# Patient Record
Sex: Female | Born: 1940 | Race: White | Hispanic: No | Smoking: Never smoker
Health system: Southern US, Community
[De-identification: ages and names within clinical notes are randomized; demographics above are authoritative.]

## PROBLEM LIST (undated history)

## (undated) DIAGNOSIS — N200 Calculus of kidney: Secondary | ICD-10-CM

## (undated) DIAGNOSIS — N390 Urinary tract infection, site not specified: Secondary | ICD-10-CM

## (undated) DIAGNOSIS — N816 Rectocele: Secondary | ICD-10-CM

## (undated) HISTORY — PX: RECTOCELE REPAIR: SHX761

## (undated) HISTORY — PX: HYSTERECTOMY ABDOMINAL WITH SALPINGECTOMY: SHX6725

## (undated) HISTORY — PX: BACK SURGERY: SHX140

## (undated) HISTORY — PX: CARDIAC ELECTROPHYSIOLOGY STUDY AND ABLATION: SHX1294

---

## 1998-07-04 ENCOUNTER — Other Ambulatory Visit: Admission: RE | Admit: 1998-07-04 | Discharge: 1998-07-04 | Payer: Self-pay | Admitting: Oral Surgery

## 2000-08-06 ENCOUNTER — Other Ambulatory Visit: Admission: RE | Admit: 2000-08-06 | Discharge: 2000-08-06 | Payer: Self-pay | Admitting: Obstetrics and Gynecology

## 2001-04-28 ENCOUNTER — Other Ambulatory Visit: Admission: RE | Admit: 2001-04-28 | Discharge: 2001-04-28 | Payer: Self-pay | Admitting: Obstetrics and Gynecology

## 2001-09-06 ENCOUNTER — Other Ambulatory Visit: Admission: RE | Admit: 2001-09-06 | Discharge: 2001-09-06 | Payer: Self-pay | Admitting: Obstetrics and Gynecology

## 2011-03-12 DIAGNOSIS — F329 Major depressive disorder, single episode, unspecified: Secondary | ICD-10-CM | POA: Insufficient documentation

## 2011-06-04 ENCOUNTER — Other Ambulatory Visit: Payer: Self-pay | Admitting: Sports Medicine

## 2011-06-04 ENCOUNTER — Ambulatory Visit
Admission: RE | Admit: 2011-06-04 | Discharge: 2011-06-04 | Disposition: A | Discharge status: Home / Self Care | Payer: Medicare FFS | Source: Ambulatory Visit | Attending: Sports Medicine | Admitting: Sports Medicine

## 2011-06-04 DIAGNOSIS — R52 Pain, unspecified: Secondary | ICD-10-CM

## 2013-07-27 DIAGNOSIS — R002 Palpitations: Secondary | ICD-10-CM | POA: Insufficient documentation

## 2013-08-08 DIAGNOSIS — F411 Generalized anxiety disorder: Secondary | ICD-10-CM | POA: Insufficient documentation

## 2013-12-28 DIAGNOSIS — L658 Other specified nonscarring hair loss: Secondary | ICD-10-CM | POA: Insufficient documentation

## 2013-12-28 DIAGNOSIS — L739 Follicular disorder, unspecified: Secondary | ICD-10-CM | POA: Insufficient documentation

## 2014-12-17 DIAGNOSIS — R0683 Snoring: Secondary | ICD-10-CM | POA: Insufficient documentation

## 2014-12-17 DIAGNOSIS — G4733 Obstructive sleep apnea (adult) (pediatric): Secondary | ICD-10-CM | POA: Insufficient documentation

## 2014-12-17 DIAGNOSIS — G4710 Hypersomnia, unspecified: Secondary | ICD-10-CM | POA: Insufficient documentation

## 2015-09-02 DIAGNOSIS — M533 Sacrococcygeal disorders, not elsewhere classified: Secondary | ICD-10-CM | POA: Insufficient documentation

## 2015-09-02 DIAGNOSIS — M79671 Pain in right foot: Secondary | ICD-10-CM | POA: Insufficient documentation

## 2015-09-02 DIAGNOSIS — W19XXXA Unspecified fall, initial encounter: Secondary | ICD-10-CM | POA: Insufficient documentation

## 2015-09-02 DIAGNOSIS — M25552 Pain in left hip: Secondary | ICD-10-CM | POA: Insufficient documentation

## 2015-09-02 DIAGNOSIS — M79672 Pain in left foot: Secondary | ICD-10-CM | POA: Insufficient documentation

## 2015-09-02 DIAGNOSIS — M25551 Pain in right hip: Secondary | ICD-10-CM | POA: Insufficient documentation

## 2015-10-01 DIAGNOSIS — N816 Rectocele: Secondary | ICD-10-CM | POA: Insufficient documentation

## 2015-11-02 DIAGNOSIS — N939 Abnormal uterine and vaginal bleeding, unspecified: Secondary | ICD-10-CM | POA: Insufficient documentation

## 2016-02-04 DIAGNOSIS — M797 Fibromyalgia: Secondary | ICD-10-CM | POA: Insufficient documentation

## 2016-02-05 DIAGNOSIS — I252 Old myocardial infarction: Secondary | ICD-10-CM | POA: Insufficient documentation

## 2016-08-10 DIAGNOSIS — G894 Chronic pain syndrome: Secondary | ICD-10-CM | POA: Insufficient documentation

## 2016-08-10 DIAGNOSIS — M67919 Unspecified disorder of synovium and tendon, unspecified shoulder: Secondary | ICD-10-CM | POA: Insufficient documentation

## 2017-09-21 DIAGNOSIS — F331 Major depressive disorder, recurrent, moderate: Secondary | ICD-10-CM | POA: Insufficient documentation

## 2018-04-12 DIAGNOSIS — R928 Other abnormal and inconclusive findings on diagnostic imaging of breast: Secondary | ICD-10-CM | POA: Insufficient documentation

## 2018-08-22 DIAGNOSIS — F424 Excoriation (skin-picking) disorder: Secondary | ICD-10-CM | POA: Insufficient documentation

## 2018-08-22 DIAGNOSIS — Z86010 Personal history of colonic polyps: Secondary | ICD-10-CM | POA: Insufficient documentation

## 2018-08-22 DIAGNOSIS — R7989 Other specified abnormal findings of blood chemistry: Secondary | ICD-10-CM | POA: Insufficient documentation

## 2018-08-22 DIAGNOSIS — F513 Sleepwalking [somnambulism]: Secondary | ICD-10-CM | POA: Insufficient documentation

## 2018-08-22 DIAGNOSIS — Z86018 Personal history of other benign neoplasm: Secondary | ICD-10-CM | POA: Insufficient documentation

## 2018-08-22 DIAGNOSIS — R32 Unspecified urinary incontinence: Secondary | ICD-10-CM | POA: Insufficient documentation

## 2019-01-17 DIAGNOSIS — M7061 Trochanteric bursitis, right hip: Secondary | ICD-10-CM | POA: Insufficient documentation

## 2019-05-08 DIAGNOSIS — I209 Angina pectoris, unspecified: Secondary | ICD-10-CM | POA: Insufficient documentation

## 2019-08-25 DIAGNOSIS — M23306 Other meniscus derangements, unspecified meniscus, right knee: Secondary | ICD-10-CM | POA: Insufficient documentation

## 2020-01-11 ENCOUNTER — Emergency Department (INDEPENDENT_AMBULATORY_CARE_PROVIDER_SITE_OTHER)
Admission: EM | Admit: 2020-01-11 | Discharge: 2020-01-11 | Disposition: A | Discharge status: Home / Self Care | Payer: Medicare Other | Source: Home / Self Care

## 2020-01-11 ENCOUNTER — Encounter: Payer: Self-pay | Admitting: Emergency Medicine

## 2020-01-11 ENCOUNTER — Emergency Department: Payer: Medicare Other

## 2020-01-11 ENCOUNTER — Emergency Department (INDEPENDENT_AMBULATORY_CARE_PROVIDER_SITE_OTHER): Payer: Medicare Other

## 2020-01-11 DIAGNOSIS — K644 Residual hemorrhoidal skin tags: Secondary | ICD-10-CM

## 2020-01-11 DIAGNOSIS — R319 Hematuria, unspecified: Secondary | ICD-10-CM | POA: Diagnosis not present

## 2020-01-11 DIAGNOSIS — N3091 Cystitis, unspecified with hematuria: Secondary | ICD-10-CM | POA: Diagnosis not present

## 2020-01-11 DIAGNOSIS — D72829 Elevated white blood cell count, unspecified: Secondary | ICD-10-CM

## 2020-01-11 DIAGNOSIS — K5909 Other constipation: Secondary | ICD-10-CM

## 2020-01-11 DIAGNOSIS — I2510 Atherosclerotic heart disease of native coronary artery without angina pectoris: Secondary | ICD-10-CM | POA: Insufficient documentation

## 2020-01-11 DIAGNOSIS — I10 Essential (primary) hypertension: Secondary | ICD-10-CM | POA: Insufficient documentation

## 2020-01-11 DIAGNOSIS — H9319 Tinnitus, unspecified ear: Secondary | ICD-10-CM | POA: Insufficient documentation

## 2020-01-11 DIAGNOSIS — E039 Hypothyroidism, unspecified: Secondary | ICD-10-CM | POA: Insufficient documentation

## 2020-01-11 DIAGNOSIS — E785 Hyperlipidemia, unspecified: Secondary | ICD-10-CM | POA: Insufficient documentation

## 2020-01-11 DIAGNOSIS — J309 Allergic rhinitis, unspecified: Secondary | ICD-10-CM | POA: Insufficient documentation

## 2020-01-11 DIAGNOSIS — M791 Myalgia, unspecified site: Secondary | ICD-10-CM | POA: Insufficient documentation

## 2020-01-11 DIAGNOSIS — R7301 Impaired fasting glucose: Secondary | ICD-10-CM | POA: Insufficient documentation

## 2020-01-11 HISTORY — DX: Urinary tract infection, site not specified: N39.0

## 2020-01-11 HISTORY — DX: Rectocele: N81.6

## 2020-01-11 HISTORY — DX: Calculus of kidney: N20.0

## 2020-01-11 LAB — POCT CBC W AUTO DIFF (K'VILLE URGENT CARE)

## 2020-01-11 LAB — POCT URINALYSIS DIP (MANUAL ENTRY)
Bilirubin, UA: NEGATIVE
Glucose, UA: NEGATIVE mg/dL
Leukocytes, UA: NEGATIVE
Nitrite, UA: NEGATIVE
Protein Ur, POC: 30 mg/dL — AB
Spec Grav, UA: >=1.03 — AB (ref 1.010–1.025)
Urobilinogen, UA: 0.2 E.U./dL
pH, UA: 5 (ref 5.0–8.0)

## 2020-01-11 MED ORDER — CEFTRIAXONE SODIUM 1 G IJ SOLR
1.0000 g | Freq: Once | INTRAMUSCULAR | Status: AC
  Administered 2020-01-11: 1 g via INTRAMUSCULAR

## 2020-01-11 MED ORDER — HYDROCORTISONE ACETATE 25 MG RE SUPP: 25.0000 mg | Freq: Two times a day (BID) | RECTAL | 0 refills | Status: DC

## 2020-01-11 MED ORDER — CEPHALEXIN 500 MG PO CAPS: 500.0000 mg | ORAL_CAPSULE | Freq: Two times a day (BID) | ORAL | 0 refills | Status: AC

## 2020-01-11 MED ORDER — POLYETHYLENE GLYCOL 3350 17 G PO PACK: 17.0000 g | PACK | Freq: Every day | ORAL | 0 refills | Status: AC

## 2020-01-11 NOTE — Discharge Instructions (Addendum)
  Please take your mediations as prescribed. Be sure to drink plenty of water to help with your symptoms including constipation. Be sure to follow up closely next week with your primary care provider as well as schedule an appointment with a urologist for further evaluation and treatment of urinary symptoms next week.   Call 911 or have someone drive you to the hospital if symptoms significantly worsening- including unable to urinate, abdominal pain or bloating (not being able to pass gas or stool), unable to keep down fluids, dizziness/passing out or other new concerning symptoms develop.

## 2020-01-11 NOTE — ED Triage Notes (Addendum)
Dysuria x 3 days -treating at home w/ AZO & cranberry pills per pt Chronic UTI's (5 in last 6 months) Hospitalized for urosepsis in July (can not remember exact day) No Urologist  NO COVID Vaccine  Unable to provide a urine sample in triage - given water Pt states she cleans her peri area with vagisil in the am & takes a bath in the PM

## 2020-01-11 NOTE — ED Provider Notes (Signed)
[facility]    CSN: [id number] Arrival date & time: 01/11/20  1922      History   Chief Complaint Chief Complaint  Patient presents with  . Dysuria  . Headache    HPI Christina Haley is a 79 y.o. female.   HPI  Christina Haley is a 79 y.o. female presenting to UC with c/o dysuria for 3 days, associated mild generalized HA, urinary frequency, decreased urine and constipation. She has been taking Azo and cranberry pills to help with symptoms.  She reports having "UTIs" within the last 5-6 weeks but was never seen by a provider, just self-treated at home with Azo.  She was hospitalized for 5 days in April 2021 for urosepsis.  Per medical records, she was found unresponsive and did not have urinary symptoms leading up to the diagnosis in the hospital.  She has not f/u with urology or PCP for urinary symptoms since her admission.   Denies fever, chills, n/v/d. She has bladder pressure but denies abdominal pain. She does have bilateral lower flank pain. Hx of rectocele, surgically repaired in 2017.    Past Medical History:  Diagnosis Date  . Kidney stone   . Rectocele   . UTI (urinary tract infection)     Patient Active Problem List   Diagnosis Date Noted  . Acquired hypothyroidism 01/11/2020  . Allergic rhinitis 01/11/2020  . Coronary artery disease involving native coronary artery of native heart without angina pectoris 01/11/2020  . Essential hypertension 01/11/2020  . Hyperlipidemia 01/11/2020  . Impaired fasting glucose 01/11/2020  . Myalgia 01/11/2020  . Tinnitus 01/11/2020  . Meniscus degeneration, right 08/25/2019  . Angina pectoris (HCC) 05/08/2019  . Trochanteric bursitis of right hip 01/17/2019  . Elevated TSH 08/22/2018  . 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 History of colon polyps 08/22/2018  . Skin picking habit 08/22/2018  . Sleep walking and eating 08/22/2018  . Urinary incontinence 08/22/2018  . Abnormality of left breast on screening mammogram  04/12/2018  . Moderate episode of recurrent major depressive disorder (HCC) 09/21/2017  . Chronic pain syndrome 08/10/2016  . Tendinopathy of rotator cuff 08/10/2016  . History of non-ST elevation myocardial infarction (NSTEMI) 02/05/2016  . Fibromyalgia 02/04/2016  . Abnormal uterine bleeding 11/02/2015  . Rectocele 10/01/2015  . Coccyx pain 09/02/2015  . Falls 09/02/2015  . Bilateral hip pain 09/02/2015  . Pain in both feet 09/02/2015  . Hypersomnia 12/17/2014  . Obstructive sleep apnea (adult) (pediatric) 12/17/2014  . Snoring 12/17/2014  . 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 Folliculitis 12/28/2013  . Generalized anxiety disorder 08/08/2013  . Palpitations 07/27/2013  . Other depressive disorder 03/12/2011    Past Surgical History:  Procedure Laterality Date  . HYSTERECTOMY ABDOMINAL WITH SALPINGECTOMY    . RECTOCELE REPAIR      OB History   No obstetric history on file.      Home Medications    Prior to Admission medications   Medication Sig Start Date End Date Taking? Authorizing Provider  clopidogrel (PLAVIX) 75 MG tablet Take by mouth. 12/18/19  Yes [provider]  DULoxetine (CYMBALTA) 30 MG capsule Take by mouth. 12/18/19  Yes [provider]  levothyroxine (SYNTHROID) 100 MCG tablet Take 1 tablet by mouth daily. 09/06/19  Yes [provider]  nitroGLYCERIN (NITROSTAT) 0.4 MG SL tablet Take 1 tablet by mouth every 5 minutes x 3 doses.  If no improvement after third dose, call 911. 05/08/19  Yes [provider]  pregabalin (  LYRICA) 25 MG capsule Take by mouth. 12/18/19  Yes [provider]  ranolazine (RANEXA) 500 MG 12 hr tablet Take by mouth. 12/18/19  Yes [provider]  rosuvastatin (CRESTOR) 10 MG tablet Take by mouth. 12/18/19  Yes [provider]  valACYclovir (VALTREX) 1000 MG tablet Take 2 tablet by mouth at the onset of a cold sore, and repeat 2 tablets in 12 hours - max 4 tablets per cold sore  12/18/19  Yes [provider]  cephALEXin (KEFLEX) 500 MG capsule Take 1 capsule (500 mg total) by mouth 2 (two) times daily for 10 days. 01/11/20 01/21/20  Lurene Shadow, PA-C  hydrocortisone (ANUSOL-HC) 25 MG suppository Place 1 suppository (25 mg total) rectally 2 (two) times daily. For 7 days 01/11/20   Lurene Shadow, PA-C  polyethylene glycol (MIRALAX / GLYCOLAX) 17 g packet Take 17 g by mouth daily. 01/11/20   Lurene Shadow, PA-C    Family History Family History  Problem Relation Age of Onset  . Diabetes Mother   . Stroke Mother   . Cancer Father   . Heart attack Father     Social History Social History   Tobacco Use  . Smoking status: Never Smoker  . Smokeless tobacco: Never Used  Vaping Use  . Vaping Use: Never used  Substance Use Topics  . Alcohol use: Never  . Drug use: Never     Allergies   Other, Amoxicillin, Codeine, Trazodone, Erythromycin base, and Sulfa antibiotics   Review of Systems Review of Systems  Constitutional: Negative for chills and fever.  HENT: Negative for congestion, ear pain, sore throat, trouble swallowing and voice change.   Respiratory: Negative for cough and shortness of breath.   Cardiovascular: Negative for chest pain and palpitations.  Gastrointestinal: Positive for constipation. Negative for abdominal pain, diarrhea, nausea and vomiting.  Genitourinary: Positive for decreased urine volume, dysuria, frequency and urgency.  Musculoskeletal: Negative for arthralgias, back pain and myalgias.  Skin: Negative for rash.  All other systems reviewed and are negative.    Physical Exam Triage Vital Signs ED Triage Vitals  Enc Vitals Group     BP 01/11/20 1943 (!) 149/86     Pulse Rate 01/11/20 1943 (!) 58     Resp 01/11/20 1943 18     Temp 01/11/20 1943 99.2 F (37.3 C)     Temp Source 01/11/20 1943 Oral     SpO2 01/11/20 1943 96 %     Weight 01/11/20 1945 203 lb (92.1 kg)     Height 01/11/20 1945 5\' 4"  (1.626 m)      Head Circumference --      Peak Flow --      Pain Score 01/11/20 1944 7     Pain Loc --      Pain Edu? --      Excl. in GC? --    No data found.  Updated Vital Signs BP (!) 149/86 (BP Location: Right Arm)   Pulse (!) 58   Temp 99.2 F (37.3 C) (Oral)   Resp 18   Ht 5\' 4"  (1.626 m)   Wt 203 lb (92.1 kg)   SpO2 96%   BMI 34.84 kg/m   Visual Acuity Right Eye Distance:   Left Eye Distance:   Bilateral Distance:    Right Eye Near:   Left Eye Near:    Bilateral Near:     Physical Exam Vitals and nursing note reviewed. Exam conducted with a chaperone present.  Constitutional:      General: She is not in acute distress.    Appearance: She is well-developed. She is not ill-appearing, toxic-appearing or diaphoretic.  HENT:     Head: Normocephalic and atraumatic.  Cardiovascular:     Rate and Rhythm: Normal rate and regular rhythm.  Pulmonary:     Effort: Pulmonary effort is normal. No respiratory distress.     Breath sounds: Normal breath sounds.  Abdominal:     General: There is no distension.     Palpations: Abdomen is soft.     Tenderness: There is no abdominal tenderness.  Genitourinary:    Rectum: External hemorrhoid (2cm, non-thrombosed) present. No mass or tenderness.     Comments: Small external hemorrhoid, otherwise normal rectal exam.  Musculoskeletal:        General: Normal range of motion.     Cervical back: Normal range of motion.  Skin:    General: Skin is warm and dry.  Neurological:     Mental Status: She is alert and oriented to person, place, and time.  Psychiatric:        Behavior: Behavior normal.      UC Treatments / Results  Labs (all labs ordered are listed, but only abnormal results are displayed) Labs Reviewed  POCT URINALYSIS DIP (MANUAL ENTRY) - Abnormal; Notable for the following components:      Result Value   Ketones, POC UA small (15) (*)    Spec Grav, UA >=1.030 (*)    Blood, UA moderate (*)    Protein Ur, POC =30 (*)     All other components within normal limits  URINE CULTURE  COMPLETE METABOLIC PANEL WITH GFR  POCT CBC W AUTO DIFF ([facility])    EKG   Radiology DG Abd 1 View  Result Date: 01/11/2020 CLINICAL DATA:  Back pain, hematuria and urinary frequency. EXAM: ABDOMEN - 1 VIEW COMPARISON:  None. FINDINGS: The bowel gas pattern is normal. A large amount of stool is seen throughout the colon. No radio-opaque calculi or other significant radiographic abnormality are seen. IMPRESSION: 1. Large stool burden without evidence of bowel obstruction. Electronically Signed   By: Aram Candela M.D.   On: 01/11/2020 20:40    Procedures Procedures (including critical care time)  Medications Ordered in UC Medications  cefTRIAXone (ROCEPHIN) injection 1 g (1 g Intramuscular Given 01/11/20 2021)    Initial Impression / Assessment and Plan / UC Course  I have reviewed the triage vital signs and the nursing notes.  Pertinent labs & imaging results that were available during my care of the patient were reviewed by me and considered in my medical decision making (see chart for details).    WBC: 11 Urine culture sent CMP pending  Due to hospitalization for Urosepsis earlier this year, will tx for suspected UTI Encouraged stool softener use Encouraged close f/u with PCP, she has previously scheduled appointment with PCP next Wednesday, 10/27. Encouraged to keep appointment. Discussed symptoms that warrant emergent care in the ED. AVS given   Final Clinical Impressions(s) / UC Diagnoses   Final diagnoses:  Hematuria due to cystitis  Hematuria  Hematuria, unspecified type  Other constipation  External hemorrhoid  Leukocytosis, unspecified type     Discharge Instructions      Please take your mediations as prescribed. Be sure to drink plenty of water to help with your symptoms including constipation. Be sure to follow up closely next week with your primary care provider as well as  schedule an appointment with a urologist for further evaluation and treatment of urinary symptoms next week.   Call 911 or have someone drive you to the hospital if symptoms significantly worsening- including unable to urinate, abdominal pain or bloating (not being able to pass gas or stool), unable to keep down fluids, dizziness/passing out or other new concerning symptoms develop.     ED Prescriptions    Medication Sig Dispense Auth. Provider   cephALEXin (KEFLEX) 500 MG capsule Take 1 capsule (500 mg total) by mouth 2 (two) times daily for 10 days. 20 capsule Doroteo Glassman, Nakai Yard O, PA-C   hydrocortisone (ANUSOL-HC) 25 MG suppository Place 1 suppository (25 mg total) rectally 2 (two) times daily. For 7 days 14 suppository Ronald Vinsant O, PA-C   polyethylene glycol (MIRALAX / GLYCOLAX) 17 g packet Take 17 g by mouth daily. 14 each Lurene Shadow, PA-C     PDMP not reviewed this encounter.   Lurene Shadow, [place] 01/11/20 2108

## 2020-01-12 ENCOUNTER — Telehealth: Payer: Self-pay

## 2020-01-12 LAB — COMPLETE METABOLIC PANEL WITH GFR
AG Ratio: 1.6 (calc) (ref 1.0–2.5)
ALT: 21 U/L (ref 6–29)
AST: 24 U/L (ref 10–35)
Albumin: 4.1 g/dL (ref 3.6–5.1)
Alkaline phosphatase (APISO): 55 U/L (ref 37–153)
BUN/Creatinine Ratio: 11 (calc) (ref 6–22)
BUN: 14 mg/dL (ref 7–25)
CO2: 26 mmol/L (ref 20–32)
Calcium: 9.7 mg/dL (ref 8.6–10.4)
Chloride: 105 mmol/L (ref 98–110)
Creat: 1.27 mg/dL — ABNORMAL HIGH (ref 0.60–0.93)
GFR, Est African American: 46 mL/min/{1.73_m2} — ABNORMAL LOW (ref >=60)
GFR, Est Non African American: 40 mL/min/{1.73_m2} — ABNORMAL LOW (ref >=60)
Globulin: 2.5 g/dL (calc) (ref 1.9–3.7)
Glucose, Bld: 176 mg/dL — ABNORMAL HIGH (ref 65–99)
Potassium: 4.9 mmol/L (ref 3.5–5.3)
Sodium: 140 mmol/L (ref 135–146)
Total Bilirubin: 0.2 mg/dL (ref 0.2–1.2)
Total Protein: 6.6 g/dL (ref 6.1–8.1)

## 2020-01-12 NOTE — Telephone Encounter (Signed)
Called to check on patient after recent visit. No answer at this time, voicemail left.

## 2020-01-13 LAB — URINE CULTURE
MICRO NUMBER:: 11105456
Result:: NO GROWTH
SPECIMEN QUALITY:: ADEQUATE

## 2021-06-16 ENCOUNTER — Other Ambulatory Visit: Payer: Self-pay

## 2021-06-16 ENCOUNTER — Emergency Department (INDEPENDENT_AMBULATORY_CARE_PROVIDER_SITE_OTHER): Payer: Medicare (Managed Care)

## 2021-06-16 ENCOUNTER — Emergency Department
Admission: EM | Admit: 2021-06-16 | Discharge: 2021-06-16 | Disposition: A | Discharge status: Home / Self Care | Payer: Medicare (Managed Care) | Source: Home / Self Care | Attending: Family Medicine | Admitting: Family Medicine

## 2021-06-16 DIAGNOSIS — R519 Headache, unspecified: Secondary | ICD-10-CM

## 2021-06-16 DIAGNOSIS — R110 Nausea: Secondary | ICD-10-CM

## 2021-06-16 DIAGNOSIS — R0989 Other specified symptoms and signs involving the circulatory and respiratory systems: Secondary | ICD-10-CM | POA: Diagnosis not present

## 2021-06-16 DIAGNOSIS — Y92009 Unspecified place in unspecified non-institutional (private) residence as the place of occurrence of the external cause: Secondary | ICD-10-CM

## 2021-06-16 DIAGNOSIS — W19XXXA Unspecified fall, initial encounter: Secondary | ICD-10-CM

## 2021-06-16 DIAGNOSIS — B9689 Other specified bacterial agents as the cause of diseases classified elsewhere: Secondary | ICD-10-CM | POA: Diagnosis not present

## 2021-06-16 DIAGNOSIS — R059 Cough, unspecified: Secondary | ICD-10-CM | POA: Diagnosis not present

## 2021-06-16 DIAGNOSIS — J019 Acute sinusitis, unspecified: Secondary | ICD-10-CM | POA: Diagnosis not present

## 2021-06-16 MED ORDER — BENZONATATE 200 MG PO CAPS: 200.0000 mg | ORAL_CAPSULE | Freq: Two times a day (BID) | ORAL | 0 refills | Status: AC | PRN

## 2021-06-16 MED ORDER — CEFDINIR 300 MG PO CAPS: 300.0000 mg | ORAL_CAPSULE | Freq: Two times a day (BID) | ORAL | 0 refills | Status: AC

## 2021-06-16 MED ORDER — KETOROLAC TROMETHAMINE 15 MG/ML IJ SOLN
15.0000 mg | Freq: Once | INTRAMUSCULAR | Status: AC
  Administered 2021-06-16: 15 mg via INTRAMUSCULAR

## 2021-06-16 MED ORDER — PREDNISONE 20 MG PO TABS: 20.0000 mg | ORAL_TABLET | Freq: Two times a day (BID) | ORAL | 0 refills | Status: AC

## 2021-06-16 NOTE — Discharge Instructions (Addendum)
Take antibiotic Omnicef 2 times a day. ?Take the prednisone 2 times a day.  This will help with the congestion and the wheezing ?I have prescribed Tessalon Perles for the cough ?Drink lots of water ?Get a saline/salt water nasal rinse ?See your doctor tomorrow ?See your ENT next week ?

## 2021-06-16 NOTE — ED Provider Notes (Signed)
?[facility] ? ? ? ?CSN: [id number] ?Arrival date & time: 06/16/21  1557 ? ? ?  ? ?History   ?Chief Complaint ?Chief Complaint  ?Patient presents with  ? Facial Injury  ?  Facial injury. X3 weeks  ? Cough  ?  Cough, head congestion and nausea. X2-3 weeks  ? ? ?HPI ?Christina Haley is a 81 y.o. female.  ? ?HPI ? ?Patient is here for 2 problems.  First she had a fall about 3 weeks ago.  Was walking down steps.  It was dark at the bottom of the steps.  She missed the last step and fell forward.  Hit her face.  Had a nosebleed.  Had some bruising that is resolved.  Continues to have pain in the left side of her cheek, and her nose, and cannot breathe through her left nostril.  She states that she called her ear nose and throat doctor.  She has an appointment for next week.  She states that they told her "be sure to bring x-rays". ?Patient also has chronic bronchitis.  She has sinus congestion, postnasal drip, mucus and cough.  Productive cough.  Chest hurts from coughing.  She is tired from all the coughing.  States that she has had a fever.  Thinks she had a fever today.  States she has not had pneumonia.  She has not had vaccinations for COVID or flu.  This has been going on for a couple of weeks, does not think it is COVID.  She states that she has been staying with her daughter because she feels too sick to be living alone.  Her daughter lives in [place] and recommended she come here for evaluation.  Interestingly, she has an appointment with her primary care doctor tomorrow.  She came here today because she feels sick and she desires the face x-rays ? ?Past Medical History:  ?Diagnosis Date  ? Kidney stone   ? Rectocele   ? UTI (urinary tract infection)   ? ? ?Patient Active Problem List  ? Diagnosis Date Noted  ? Acquired hypothyroidism 01/11/2020  ? Allergic rhinitis 01/11/2020  ? Coronary artery disease involving native coronary artery of native heart without angina pectoris 01/11/2020  ? Essential  hypertension 01/11/2020  ? Hyperlipidemia 01/11/2020  ? Impaired fasting glucose 01/11/2020  ? Myalgia 01/11/2020  ? Tinnitus 01/11/2020  ? Meniscus degeneration, right 08/25/2019  ? Angina pectoris (HCC) 05/08/2019  ? Trochanteric bursitis of right hip 01/17/2019  ? Elevated TSH 08/22/2018  ? 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 History of colon polyps 08/22/2018  ? Skin picking habit 08/22/2018  ? Sleep walking and eating 08/22/2018  ? Urinary incontinence 08/22/2018  ? Abnormality of left breast on screening mammogram 04/12/2018  ? Moderate episode of recurrent major depressive disorder (HCC) 09/21/2017  ? Chronic pain syndrome 08/10/2016  ? Tendinopathy of rotator cuff 08/10/2016  ? History of non-ST elevation myocardial infarction (NSTEMI) 02/05/2016  ? Fibromyalgia 02/04/2016  ? Abnormal uterine bleeding 11/02/2015  ? Rectocele 10/01/2015  ? Coccyx pain 09/02/2015  ? Falls 09/02/2015  ? Bilateral hip pain 09/02/2015  ? Pain in both feet 09/02/2015  ? Hypersomnia 12/17/2014  ? Obstructive sleep apnea (adult) (pediatric) 12/17/2014  ? Snoring 12/17/2014  ? 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 Folliculitis 12/28/2013  ? Generalized anxiety disorder 08/08/2013  ? Palpitations 07/27/2013  ? Other depressive disorder 03/12/2011  ? ? ?Past Surgical History:  ?Procedure Laterality Date  ? BACK SURGERY    ?  CARDIAC ELECTROPHYSIOLOGY STUDY AND ABLATION    ? HYSTERECTOMY ABDOMINAL WITH SALPINGECTOMY    ? RECTOCELE REPAIR    ? ? ?OB History   ?No obstetric history on file. ?  ? ? ? ?Home Medications   ? ?Prior to Admission medications   ?Medication Sig Start Date End Date Taking? Authorizing Provider  ?benzonatate (TESSALON) 200 MG capsule Take 1 capsule (200 mg total) by mouth 2 (two) times daily as needed for cough. 06/16/21  Yes Eustace MooreNelson, Lorynn Moeser Sue, MD  ?cefdinir (OMNICEF) 300 MG capsule Take 1 capsule (300 mg total) by mouth 2 (two) times daily. 06/16/21  Yes Eustace MooreNelson, Dahlia Nifong Sue, MD  ?clopidogrel (PLAVIX) 75 MG  tablet Take by mouth. 12/18/19  Yes [provider]  ?DULoxetine (CYMBALTA) 30 MG capsule Take by mouth. 12/18/19  Yes [provider]  ?levothyroxine (SYNTHROID) 100 MCG tablet Take 1 tablet by mouth daily. 09/06/19  Yes [provider]  ?polyethylene glycol (MIRALAX / GLYCOLAX) 17 g packet Take 17 g by mouth daily. 01/11/20  Yes Lurene ShadowPhelps, Erin O, PA-C  ?predniSONE (DELTASONE) 20 MG tablet Take 1 tablet (20 mg total) by mouth 2 (two) times daily with a meal. 06/16/21  Yes Eustace MooreNelson, Pawan Knechtel Sue, MD  ?pregabalin (LYRICA) 25 MG capsule Take by mouth. 12/18/19  Yes [provider]  ?ranolazine (RANEXA) 500 MG 12 hr tablet Take by mouth. 12/18/19  Yes [provider]  ?rosuvastatin (CRESTOR) 10 MG tablet Take by mouth. 12/18/19  Yes [provider]  ?valACYclovir (VALTREX) 1000 MG tablet Take 2 tablet by mouth at the onset of a cold sore, and repeat 2 tablets in 12 hours - max 4 tablets per cold sore 12/18/19  Yes [provider]  ?nitroGLYCERIN (NITROSTAT) 0.4 MG SL tablet Take 1 tablet by mouth every 5 minutes x 3 doses.  If no improvement after third dose, call 911. 05/08/19   [provider]  ? ? ?Family History ?Family History  ?Problem Relation Age of Onset  ? Diabetes Mother   ? Stroke Mother   ? Cancer Father   ? Heart attack Father   ? ? ?Social History ?Social History  ? ?Tobacco Use  ? Smoking status: Never  ? Smokeless tobacco: Never  ?Vaping Use  ? Vaping Use: Never used  ?Substance Use Topics  ? Alcohol use: Never  ? Drug use: Never  ? ? ? ?Allergies   ?Other, Amoxicillin, Codeine, Trazodone, Erythromycin base, and Sulfa antibiotics ? ? ?Review of Systems ?Review of Systems ?See HPI ? ?Physical Exam ?Triage Vital Signs ?ED Triage Vitals  ?Enc Vitals Group  ?   BP 06/16/21 1622 135/71  ?   Pulse Rate 06/16/21 1622 67  ?   Resp 06/16/21 1622 18  ?   Temp 06/16/21 1622 99.6 ?F (37.6 ?C)  ?   Temp Source 06/16/21 1622 Oral  ?   SpO2 06/16/21 1622 94 %   ?   Weight 06/16/21 1618 189 lb (85.7 kg)  ?   Height 06/16/21 1618 5\' 4"  (1.626 m)  ?   Head Circumference --   ?   Peak Flow --   ?   Pain Score 06/16/21 1618 7  ?   Pain Loc --   ?   Pain Edu? --   ?   Excl. in GC? --   ? ?No data found. ? ?Updated Vital Signs ?BP 135/71 (BP Location: Right Arm)   Pulse 67   Temp 99.6 ?F (37.6 ?C) (Oral)  Resp 18   Ht 5\' 4"  (1.626 m)   Wt 85.7 kg   SpO2 94%   BMI 32.44 kg/m?  ?    ? ?Physical Exam ?Constitutional:   ?   General: She is not in acute distress. ?   Appearance: She is well-developed. She is obese. She is ill-appearing.  ?HENT:  ?   Head: Normocephalic and atraumatic.  ?   Right Ear: Tympanic membrane and ear canal normal.  ?   Left Ear: Tympanic membrane and ear canal normal.  ?   Nose: Congestion present.  ?   Comments: Nasal membranes are swollen and red.  The nose was examined with a speculum.  No obstruction seen ?   Mouth/Throat:  ?   Mouth: Mucous membranes are dry.  ?   Comments: Tongue is dry.  Posterior pharynx shows some yellow PN D.  Sinus tenderness present on the left maxillary region only ?Eyes:  ?   Conjunctiva/sclera: Conjunctivae normal.  ?   Pupils: Pupils are equal, round, and reactive to light.  ?Cardiovascular:  ?   Rate and Rhythm: Normal rate and regular rhythm.  ?Pulmonary:  ?   Effort: Pulmonary effort is normal. No respiratory distress.  ?   Breath sounds: Wheezing and rhonchi present.  ?   Comments: Patient has a harsh barking cough.  She has rhonchi in both lungs with scattered inspiratory wheeze. ?Abdominal:  ?   General: There is no distension.  ?   Palpations: Abdomen is soft.  ?Musculoskeletal:     ?   General: Normal range of motion.  ?   Cervical back: Normal range of motion.  ?Lymphadenopathy:  ?   Cervical: No cervical adenopathy.  ?Skin: ?   General: Skin is warm and dry.  ?Neurological:  ?   General: No focal deficit present.  ?   Mental Status: She is alert.  ?Psychiatric:     ?   Mood and Affect: Mood normal.     ?    Behavior: Behavior normal.  ? ? ? ?UC Treatments / Results  ?Labs ?(all labs ordered are listed, but only abnormal results are displayed) ?Labs Reviewed - No data to display ? ?EKG ? ? ?Radiology ?DG Facial Bones C

## 2021-06-16 NOTE — ED Triage Notes (Signed)
Pt states that she fell x3 weeks ago and has some facial pain. Pt states that she injured her noise and has some facial pain. X3 weeks ? ?Pt states that she also has a cough, nausea and head congestion. X2-3 weeks ? ?Pt states that she isn't vaccinated for covid.  ?Pt states that she has had flu vaccine.  ?

## 2022-12-16 IMAGING — DX DG FACIAL BONES COMPLETE 3+V
6 series · 6 of 6 positions shown · non-contrast
Comparison: None.

CLINICAL DATA: Fell 3 weeks ago, facial pain, cough and congestion

EXAM:
FACIAL BONES COMPLETE 3+V

[facial pa townes]
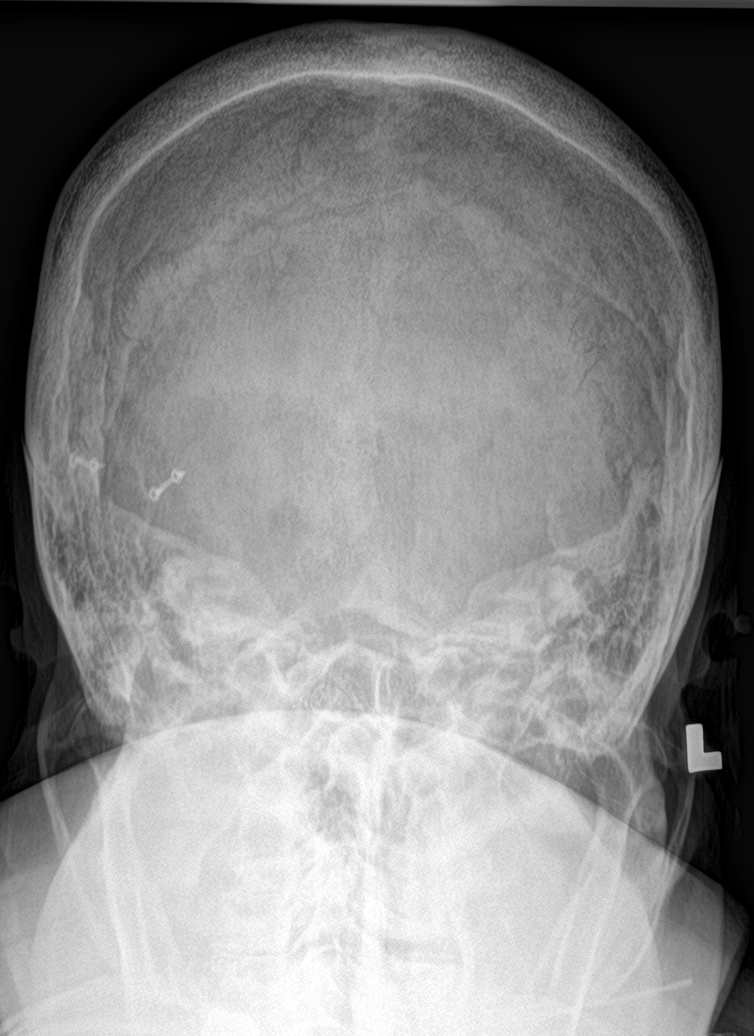

[facial waters (1 of 2)]
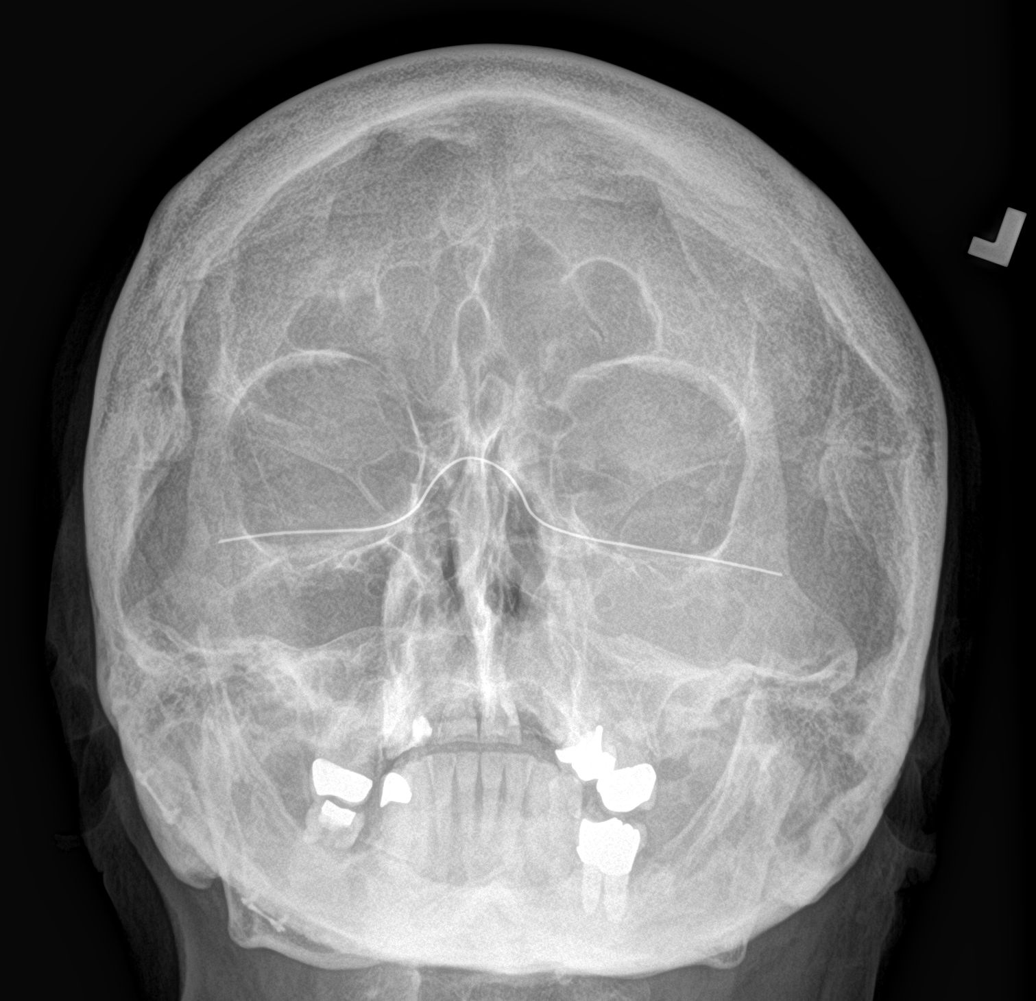

[facial lateral (1 of 2)]
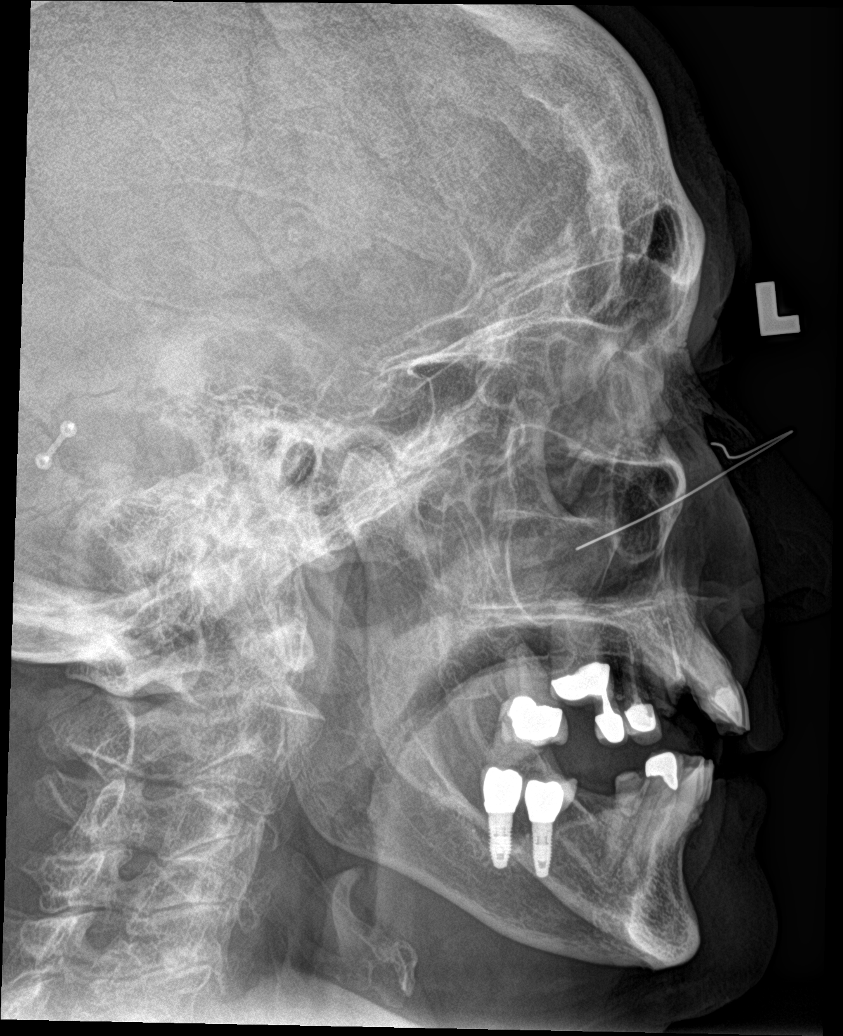

[facial smv]
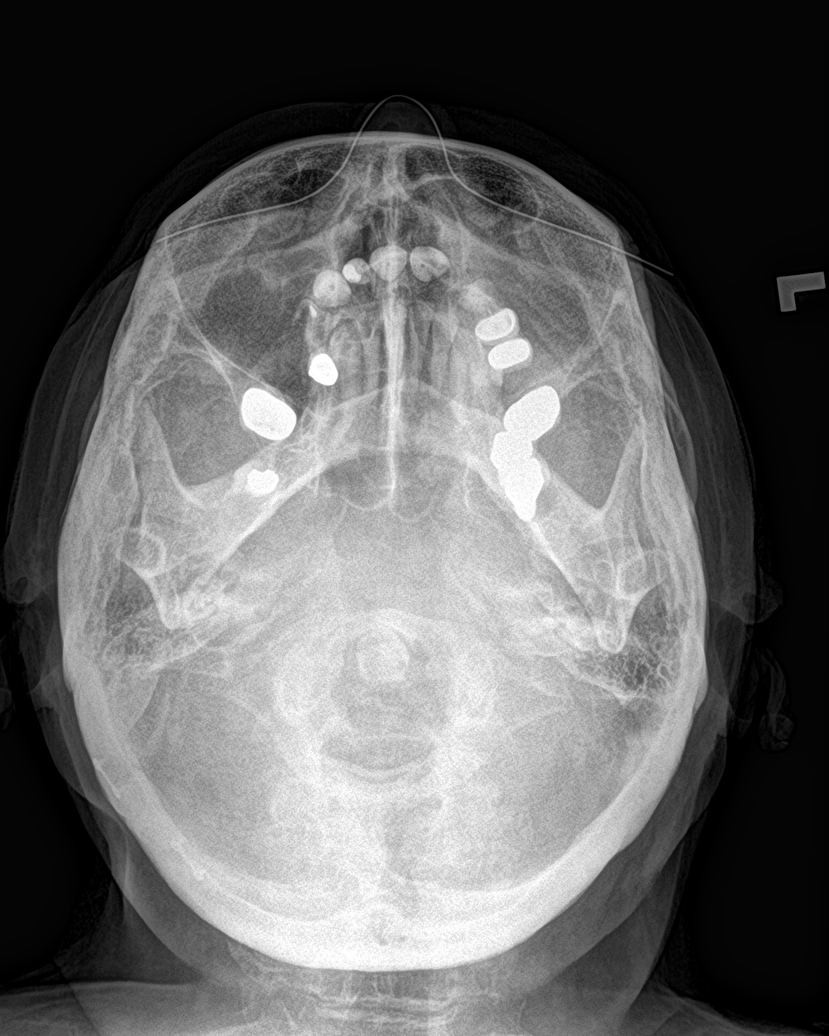

[facial lateral (2 of 2)]
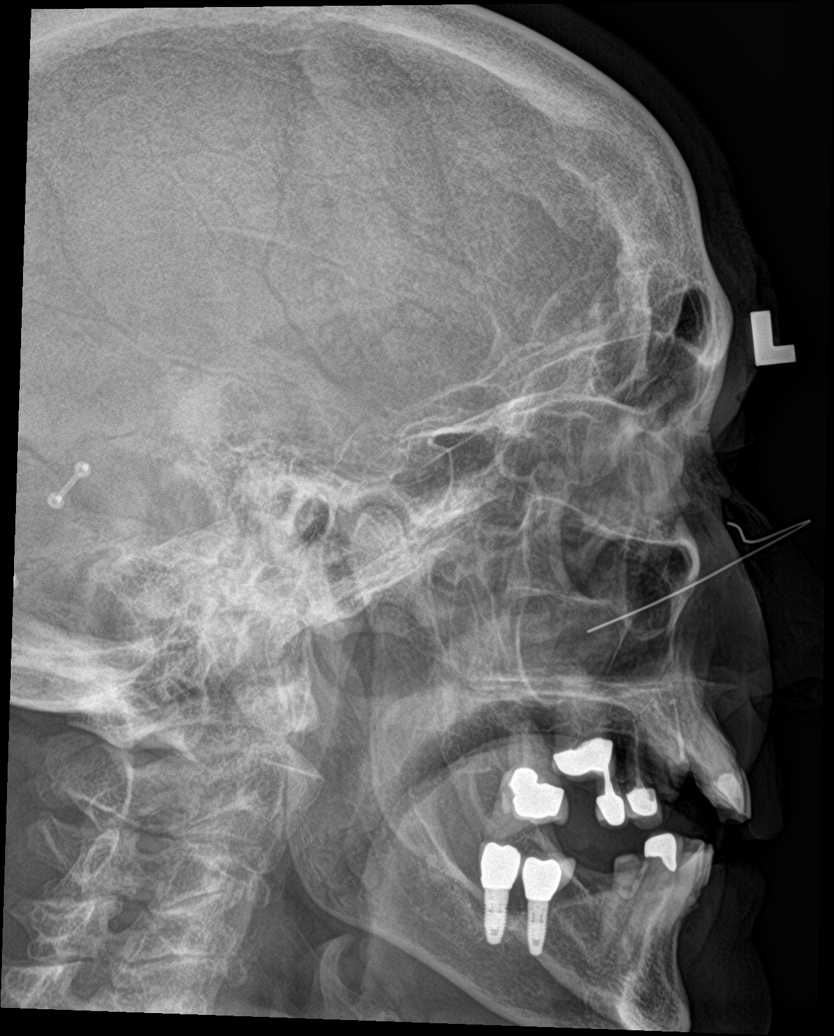

[facial waters (2 of 2)]
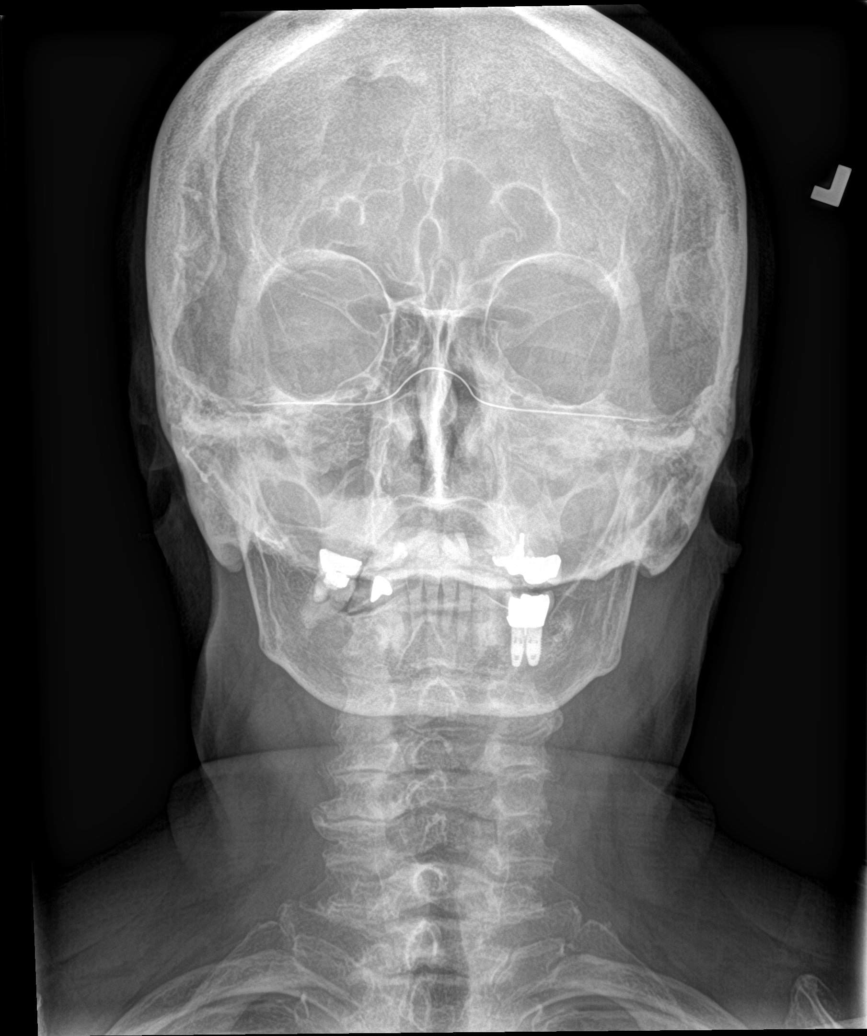

[6 of 6 positions shown; findings below may reference images not displayed]

FINDINGS: Frontal, Litzler, Bras, submental vertex, and lateral views of the
facial bones and skull are obtained. There are no acute displaced
fractures identified. There is opacification of the left maxillary
sinus. Remaining paranasal sinuses are clear. Nasal septum is
midline. Airway is patent.
IMPRESSION: 1. Opacification of the left maxillary sinus.
2. No acute displaced fractures.
